# Patient Record
Sex: Female | Born: 1965 | Race: Black or African American | Hispanic: No | Marital: Single
Health system: Southern US, Community
[De-identification: ages and names within clinical notes are randomized; demographics above are authoritative.]

## PROBLEM LIST (undated history)

## (undated) DIAGNOSIS — I10 Essential (primary) hypertension: Secondary | ICD-10-CM

## (undated) DIAGNOSIS — J45909 Unspecified asthma, uncomplicated: Secondary | ICD-10-CM

---

## 2019-10-21 ENCOUNTER — Encounter (HOSPITAL_COMMUNITY): Payer: Self-pay

## 2019-10-21 ENCOUNTER — Emergency Department (HOSPITAL_COMMUNITY)
Admission: EM | Admit: 2019-10-21 | Discharge: 2019-10-21 | Disposition: A | Discharge status: Home / Self Care | Payer: BLUE CROSS/BLUE SHIELD | Attending: Emergency Medicine | Admitting: Emergency Medicine

## 2019-10-21 ENCOUNTER — Emergency Department (HOSPITAL_COMMUNITY): Payer: BLUE CROSS/BLUE SHIELD

## 2019-10-21 ENCOUNTER — Other Ambulatory Visit: Payer: Self-pay

## 2019-10-21 ENCOUNTER — Emergency Department (HOSPITAL_COMMUNITY)
Admission: EM | Admit: 2019-10-21 | Discharge: 2019-10-22 | Disposition: A | Discharge status: Home / Self Care | Payer: BLUE CROSS/BLUE SHIELD | Source: Home / Self Care | Attending: Emergency Medicine | Admitting: Emergency Medicine

## 2019-10-21 DIAGNOSIS — R0789 Other chest pain: Secondary | ICD-10-CM | POA: Diagnosis present

## 2019-10-21 DIAGNOSIS — R440 Auditory hallucinations: Secondary | ICD-10-CM | POA: Insufficient documentation

## 2019-10-21 DIAGNOSIS — U071 COVID-19: Secondary | ICD-10-CM | POA: Insufficient documentation

## 2019-10-21 DIAGNOSIS — F419 Anxiety disorder, unspecified: Secondary | ICD-10-CM | POA: Diagnosis not present

## 2019-10-21 DIAGNOSIS — I10 Essential (primary) hypertension: Secondary | ICD-10-CM | POA: Insufficient documentation

## 2019-10-21 DIAGNOSIS — R079 Chest pain, unspecified: Secondary | ICD-10-CM

## 2019-10-21 DIAGNOSIS — G9340 Encephalopathy, unspecified: Secondary | ICD-10-CM | POA: Insufficient documentation

## 2019-10-21 DIAGNOSIS — R410 Disorientation, unspecified: Secondary | ICD-10-CM | POA: Insufficient documentation

## 2019-10-21 DIAGNOSIS — J45909 Unspecified asthma, uncomplicated: Secondary | ICD-10-CM | POA: Diagnosis not present

## 2019-10-21 DIAGNOSIS — R000 Tachycardia, unspecified: Secondary | ICD-10-CM | POA: Insufficient documentation

## 2019-10-21 HISTORY — DX: Essential (primary) hypertension: I10

## 2019-10-21 HISTORY — DX: Unspecified asthma, uncomplicated: J45.909

## 2019-10-21 LAB — BASIC METABOLIC PANEL
Anion gap: 4 — ABNORMAL LOW (ref 5–15)
BUN: 10 mg/dL (ref 6–20)
CO2: 25 mmol/L (ref 22–32)
Calcium: 9.1 mg/dL (ref 8.9–10.3)
Chloride: 105 mmol/L (ref 98–111)
Creatinine, Ser: 1 mg/dL (ref 0.44–1.00)
GFR calc Af Amer: >60 mL/min (ref >60)
GFR calc non Af Amer: >60 mL/min (ref >60)
Glucose, Bld: 104 mg/dL — ABNORMAL HIGH (ref 70–99)
Potassium: 3.6 mmol/L (ref 3.5–5.1)
Sodium: 134 mmol/L — ABNORMAL LOW (ref 135–145)

## 2019-10-21 LAB — CBC
HCT: 42.3 % (ref 36.0–46.0)
Hemoglobin: 13.5 g/dL (ref 12.0–15.0)
MCH: 29 pg (ref 26.0–34.0)
MCHC: 31.9 g/dL (ref 30.0–36.0)
MCV: 90.8 fL (ref 80.0–100.0)
Platelets: 316 10*3/uL (ref 150–400)
RBC: 4.66 MIL/uL (ref 3.87–5.11)
RDW: 13.2 % (ref 11.5–15.5)
WBC: 10.2 10*3/uL (ref 4.0–10.5)
nRBC: 0 % (ref 0.0–0.2)

## 2019-10-21 LAB — TROPONIN I (HIGH SENSITIVITY)
Troponin I (High Sensitivity): 9 ng/L (ref <18)
Troponin I (High Sensitivity): 8 ng/L (ref <18)

## 2019-10-21 MED ORDER — ALBUTEROL SULFATE HFA 108 (90 BASE) MCG/ACT IN AERS
2.0000 | INHALATION_SPRAY | RESPIRATORY_TRACT | Status: DC | PRN
  Administered 2019-10-21: 23:00:00 2 via RESPIRATORY_TRACT
  Filled 2019-10-21: qty 6.7

## 2019-10-21 MED ORDER — ASPIRIN 81 MG PO CHEW
324.0000 mg | CHEWABLE_TABLET | Freq: Once | ORAL | Status: AC
  Administered 2019-10-21: 324 mg via ORAL
  Filled 2019-10-21: qty 4

## 2019-10-21 MED ORDER — SODIUM CHLORIDE 0.9 % IV BOLUS
500.0000 mL | Freq: Once | INTRAVENOUS | Status: AC
  Administered 2019-10-21: 19:00:00 500 mL via INTRAVENOUS

## 2019-10-21 MED ORDER — IOHEXOL 350 MG/ML SOLN
80.0000 mL | Freq: Once | INTRAVENOUS | Status: AC | PRN
  Administered 2019-10-21: 22:00:00 80 mL via INTRAVENOUS

## 2019-10-21 NOTE — ED Notes (Signed)
Patient came into the ED via [facility] was at the registation and started pulling the seatbelts off and trying to get off the stretcher. Patient was acting very bizarre ems lowered the stretcher and patient ran out the ems doors. Ems and security tried to stop the patient because she had and IV in her arm. Patient ran up the hill and wasn't able to stop patient. Family was contacted by ems to let them know what had happened.

## 2019-10-21 NOTE — ED Triage Notes (Signed)
Pt tested positive for COVID today at an UC and sent her here for evaluation of chest pain that started last night. Pt from [place]. In [place] to take care of her elderly mother.  Hx of asthma and HTN but she is not on any BP medications. Pt states she feels as if she is laying in an ICU, feels very disoriented and confused. Pt states she has been hearing beeping noises all night. Pt very anxious in triage. resp e.u

## 2019-10-21 NOTE — Discharge Instructions (Addendum)
You were seen in the emergency department for evaluation of chest pain and a diagnosis of COVID-19 virus.  You had a CAT scan of your head and chest along with blood work and EKG.  There is no evidence of a heart attack or any blood clot.  There was an incidental finding of a thyroid nodule that will need follow-up testing.  Please follow-up with your doctor and return to the emergency department if any acute worsening symptoms.

## 2019-10-21 NOTE — ED Provider Notes (Signed)
[facility] EMERGENCY DEPARTMENT Provider Note   CSN: [id number] Arrival date & time: 10/21/19  1740     History Chief Complaint  Patient presents with  . COVID+  . Chest Pain  . Hallucinations    Traci Hodges is a 54 y.o. female.  She has a history of hypertension.  She is not on any medication for she was hoping to lose some weight to control it.  She is moved down here and staying with her mother because she was becoming more infirmed and wanting to take care of her and keep her out of her nursing home.  Today she woke up around 2 AM noticed her heart was racing and felt some chest discomfort radiating her to her left arm.  She went to an urgent care clinic where they did an EKG and a Covid test and told her she was Covid positive.  No known fevers minimal cough minimal sore throat no vomiting or diarrhea.  She feels very out of it like she is asleep and having trouble waking up.  She is hearing beeping noises.  The history is provided by the patient.  Chest Pain Pain location:  L chest Pain quality: throbbing   Pain radiates to:  L shoulder Pain severity:  Moderate Onset quality:  Sudden Timing:  Constant Progression:  Unchanged Chronicity:  New Context: at rest   Relieved by:  None tried Worsened by:  Nothing Ineffective treatments:  None tried Associated symptoms: altered mental status (?), anxiety and cough   Associated symptoms: no abdominal pain, no dysphagia, no fever, no headache, no heartburn, no nausea, no numbness, no shortness of breath and no vomiting        Past Medical History:  Diagnosis Date  . Asthma   . Hypertension     There are no problems to display for this patient.   History reviewed. No pertinent surgical history.   OB History   No obstetric history on file.     No family history on file.  Social History   Tobacco Use  . Smoking status: Not on file  Substance Use Topics  . Alcohol use: Not on file  .  Drug use: Not on file    Home Medications Prior to Admission medications   Not on File    Allergies    Patient has no allergy information on record.  Review of Systems   Review of Systems  Constitutional: Negative for fever.  HENT: Negative for sore throat and trouble swallowing.   Eyes: Negative for visual disturbance.  Respiratory: Positive for cough. Negative for shortness of breath.   Cardiovascular: Positive for chest pain.  Gastrointestinal: Negative for abdominal pain, heartburn, nausea and vomiting.  Genitourinary: Negative for dysuria.  Musculoskeletal: Negative for gait problem.  Skin: Negative for rash.  Neurological: Negative for numbness and headaches.  Psychiatric/Behavioral: Positive for confusion (?).    Physical Exam Updated Vital Signs BP (!) 171/117   Pulse (!) 114   Temp 98.6 F (37 C) (Oral)   Resp 20   Ht 5\' 6"  (1.676 m)   Wt 113.4 kg   SpO2 98%   BMI 40.35 kg/m   Physical Exam Vitals and nursing note reviewed.  Constitutional:      General: She is not in acute distress.    Appearance: She is well-developed.  HENT:     Head: Normocephalic and atraumatic.  Eyes:     Conjunctiva/sclera: Conjunctivae normal.  Cardiovascular:  Rate and Rhythm: Regular rhythm. Tachycardia present.     Heart sounds: Normal heart sounds. No murmur.  Pulmonary:     Effort: Pulmonary effort is normal. No respiratory distress.     Breath sounds: Normal breath sounds.  Abdominal:     Palpations: Abdomen is soft.     Tenderness: There is no abdominal tenderness.  Musculoskeletal:        General: Normal range of motion.     Cervical back: Neck supple.     Right lower leg: No tenderness.     Left lower leg: No tenderness.  Skin:    General: Skin is warm and dry.     Capillary Refill: Capillary refill takes less than 2 seconds.  Neurological:     General: No focal deficit present.     Mental Status: She is alert and oriented to person, place, and time.      Cranial Nerves: No cranial nerve deficit.     Motor: No weakness.     ED Results / Procedures / Treatments   Labs (all labs ordered are listed, but only abnormal results are displayed) Labs Reviewed  BASIC METABOLIC PANEL - Abnormal; Notable for the following components:      Result Value   Sodium 134 (*)    Glucose, Bld 104 (*)    Anion gap 4 (*)    All other components within normal limits  CBC  TROPONIN I (HIGH SENSITIVITY)    EKG EKG Interpretation  Date/Time:  Thursday October 21 2019 20:08:23 EST Ventricular Rate:  104 PR Interval:    QRS Duration: 98 QT Interval:  348 QTC Calculation: 458 R Axis:   38 Text Interpretation: Sinus tachycardia Consider right atrial enlargement Posterior infarct, old Nonspecific repol abnormality, lateral leads Poor data quality No old tracing to compare Confirmed by Meridee Score [phone]) on 10/21/2019 8:11:31 PM   Radiology CT Head Wo Contrast  Result Date: 10/21/2019 CLINICAL DATA:  54 year old female with encephalopathy. EXAM: CT HEAD WITHOUT CONTRAST TECHNIQUE: Contiguous axial images were obtained from the base of the skull through the vertex without intravenous contrast. COMPARISON:  None. FINDINGS: Brain: There is mild age-related atrophy and chronic microvascular ischemic changes. There is no acute intracranial hemorrhage. No mass effect or midline shift. No extra-axial fluid collection. Vascular: No hyperdense vessel or unexpected calcification. Skull: Normal. Negative for fracture or focal lesion. Sinuses/Orbits: The visualized paranasal sinuses and the left mastoid air cells are clear. Right mastoid effusions noted. Other: None IMPRESSION: 1. No acute intracranial pathology. 2. Mild age-related atrophy and chronic microvascular ischemic changes. 3. Right mastoid effusion. Electronically Signed   By: Elgie Collard M.D.   On: 10/21/2019 22:12   CT Angio Chest PE W/Cm &/Or Wo Cm  Result Date: 10/21/2019 CLINICAL DATA:  Shortness of  breath. Tested positive for COVID-19 today. EXAM: CT ANGIOGRAPHY CHEST WITH CONTRAST TECHNIQUE: Multidetector CT imaging of the chest was performed using the standard protocol during bolus administration of intravenous contrast. Multiplanar CT image reconstructions and MIPs were obtained to evaluate the vascular anatomy. CONTRAST:  52mL OMNIPAQUE IOHEXOL 350 MG/ML SOLN COMPARISON:  None. FINDINGS: Cardiovascular: Evaluation for pulmonary emboli is limited by streak artifact from the patient's arms.Given these limitations, no acute pulmonary embolism was detected. The main pulmonary artery is within normal limits for size. There is no CT evidence of acute right heart strain. There is an aberrant right subclavian artery, a normal variant. Heart size is normal, without pericardial effusion. Mediastinum/Nodes: --No mediastinal or hilar  lymphadenopathy. --No axillary lymphadenopathy. --No supraclavicular lymphadenopathy. --there is a left-sided thyroid nodule measuring approximately 2.5 cm. --The esophagus is unremarkable Lungs/Pleura: No pulmonary nodules or masses. No pleural effusion or pneumothorax. No focal airspace consolidation. No focal pleural abnormality. Upper Abdomen: No acute abnormality. Musculoskeletal: No chest wall abnormality. No acute or significant osseous findings. Review of the MIP images confirms the above findings. IMPRESSION: 1. Limited study as detailed above. Given these limitations, no acute pulmonary embolism was detected. 2. There is a 2.5 cm left thyroid nodule. Follow-up with a nonemergent outpatient thyroid ultrasound is recommended. 3. Incidentally noted aberrant right subclavian artery, a normal variant. Electronically Signed   By: Constance Holster M.D.   On: 10/21/2019 22:14   DG Chest Portable 1 View  Result Date: 10/21/2019 CLINICAL DATA:  COVID-19 positivity with chest pain, initial encounter EXAM: PORTABLE CHEST 1 VIEW COMPARISON:  None. FINDINGS: The heart size and  mediastinal contours are within normal limits. Both lungs are clear. The visualized skeletal structures are unremarkable. IMPRESSION: No active disease. Electronically Signed   By: Inez Catalina M.D.   On: 10/21/2019 20:40    Procedures Procedures (including critical care time)  Medications Ordered in ED Medications  albuterol (VENTOLIN HFA) 108 (90 Base) MCG/ACT inhaler 2 puff (2 puffs Inhalation Given 10/21/19 2236)  sodium chloride 0.9 % bolus 500 mL (0 mLs Intravenous Stopped 10/21/19 2109)  aspirin chewable tablet 324 mg (324 mg Oral Given 10/21/19 1850)  iohexol (OMNIPAQUE) 350 MG/ML injection 80 mL (80 mLs Intravenous Contrast Given 10/21/19 2150)    ED Course  I have reviewed the triage vital signs and the nursing notes.  Pertinent labs & imaging results that were available during my care of the patient were reviewed by me and considered in my medical decision making (see chart for details).  Clinical Course as of Oct 21 1012  Thu Feb 04, 132  [id number] 54 year old female here with acute onset of tachycardia and chest pain.  Differential includes arrhythmia, A. fib, CHF, pneumonia, anemia.  She found out she was Covid positive when she went to an urgent care.  Not really having much for Covid symptoms.  She said she is confused and may be hearing things although I think she may be just overwhelmed with taking care of her mother and now having Covid.  She is alert and oriented.  Labs coming back and first troponin is 9.  No increased white count.  Electrolytes within normal limits.  Satting 98% on room air.  Initially was tachycardic in sinus and heart rate has come down with some IV fluids.   [MB]  2220 Also troponin unchanged.  Head CT showing no acute findings.  Incidental mastoid effusion.  Chest CT limited but no gross PE.  Incidental thyroid nodule needing outpatient follow-up.   [MB]  2234 I reviewed the patient's results with her.  She seemed more resigned to the fact that this is real.  I  asked her if she was interested in talking to psychiatry and she said not at this time.  I have asked social work to reach out to her to see if they could give her some ideas about housing.  Patient states she cannot return home because she would expose her mother to Covid.   [MB]  2259 TOC trying to see if she may qualify for the Covid hotel.   [MB]  2334 Unfortunately she was not able to be set up with a Covid hospital as she is not  homeless.  Social work is seeing if she would qualify for the home visits where they would come in and check her pulse ox and make sure she is doing okay.  Patient understands that she does not meet inpatient criteria for admission.   [MB]    Clinical Course User Index [MB] Terrilee Files, MD   MDM Rules/Calculators/A&P                     Traci Hodges was evaluated in Emergency Department on 10/21/2019 for the symptoms described in the history of present illness. She was evaluated in the context of the global COVID-19 pandemic, which necessitated consideration that the patient might be at risk for infection with the SARS-CoV-2 virus that causes COVID-19. Institutional protocols and algorithms that pertain to the evaluation of patients at risk for COVID-19 are in a state of rapid change based on information released by regulatory bodies including the CDC and federal and state organizations. These policies and algorithms were followed during the patient's care in the ED.   Final Clinical Impression(s) / ED Diagnoses Final diagnoses:  Nonspecific chest pain  COVID-19 virus infection    Rx / DC Orders ED Discharge Orders    None       Terrilee Files, MD 10/22/19 1014

## 2019-10-22 NOTE — Care Management (Signed)
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 Patient is concerned about not having space to isolate sleeping on the sofa in the living room. Patient has comorbidities and c/o CP.  She has made arrangement to isolate at the [facility] [place] on [address],  CM contacted [facility] and they will follow up with patient, CM explained that Staff will have to wear PPE before entering room patient verbalizes understanding. CM will send referral to [facility] [phone]

## 2019-10-22 NOTE — ED Notes (Signed)
Patient verbalizes understanding of discharge instructions. Opportunity for questioning and answers were provided. Armband removed by staff, pt discharged from ED via taxi voucher. Tamsen Roers, Nicki [place], Sister, and this RN made pt aware of plan to go to [facility] hotel set up by her sister.

## 2020-12-09 IMAGING — CT CT HEAD W/O CM
3 series · 16 of 47 positions shown, 19 images · non-contrast
Comparison: None.

CLINICAL DATA: 53-year-old female with encephalopathy.

EXAM:
CT HEAD WITHOUT CONTRAST
TECHNIQUE: Contiguous axial images were obtained from the base of the skull
through the vertex without intravenous contrast.

[Series 3: head 5.0 h30s · axial · 0.47mm/px · z∈[-113,+22]mm · 10 of 33 slices shown, 13 images]
[im 3/33  brain]
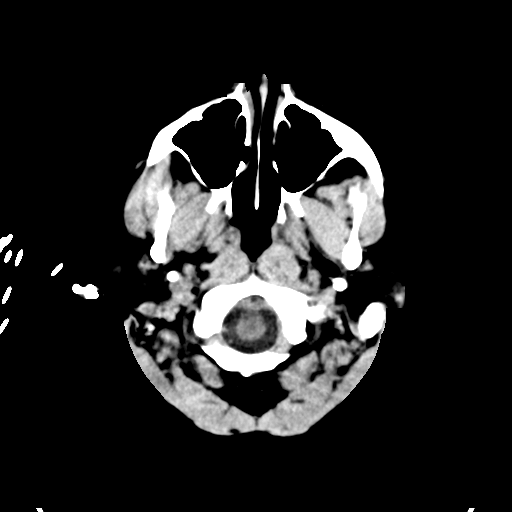
[im 3/33  bone]
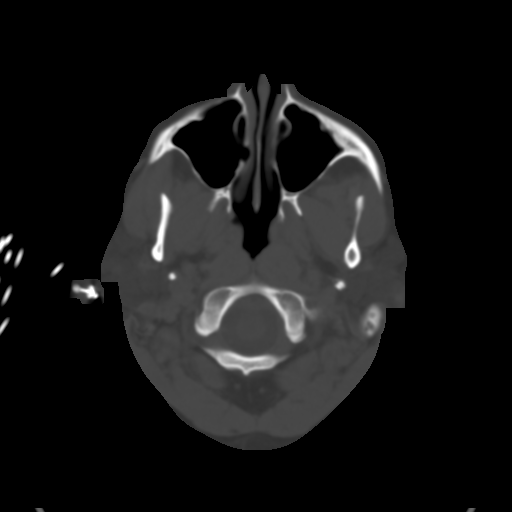
[im 6/33  brain]
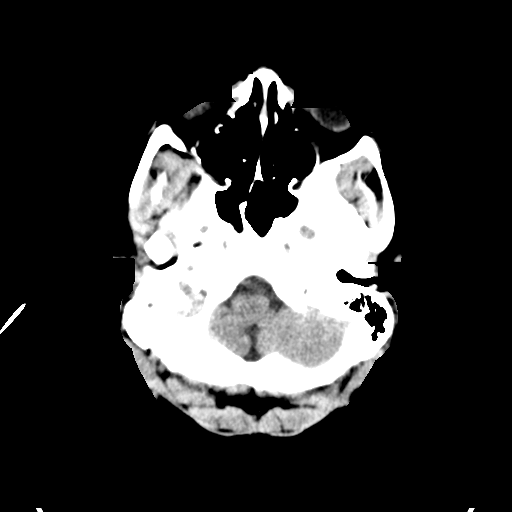
[im 9/33  brain]
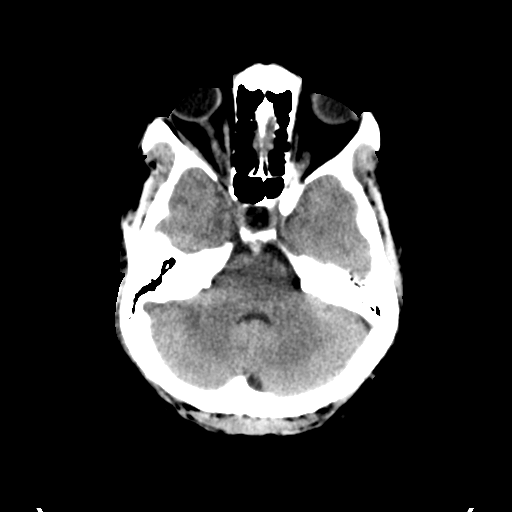
[im 12/33  brain]
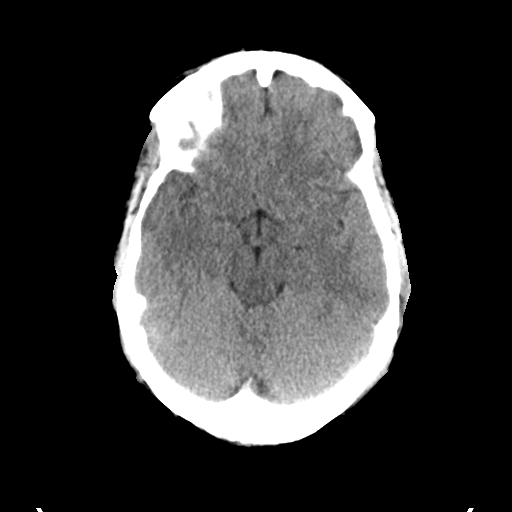
[im 15/33  brain]
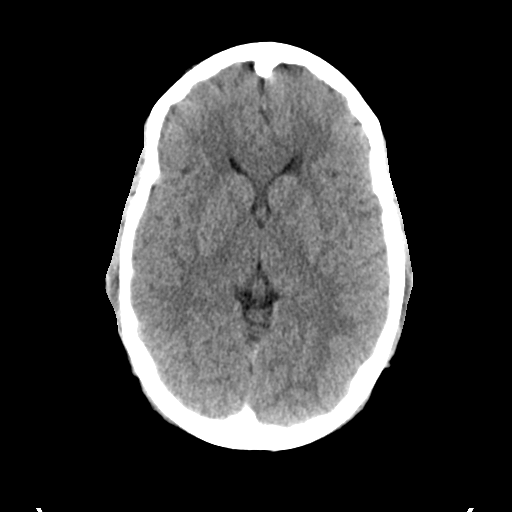
[im 15/33  bone]
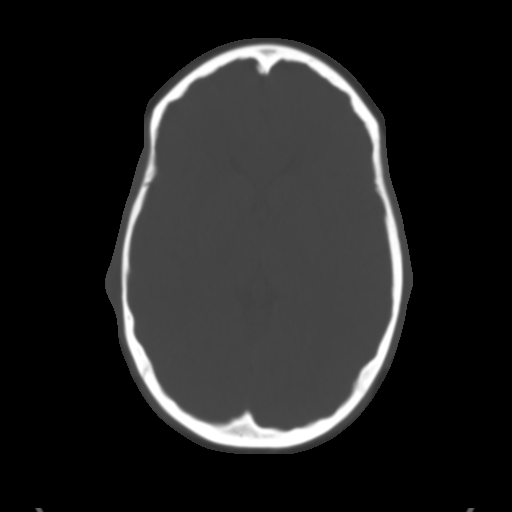
[im 18/33  brain]
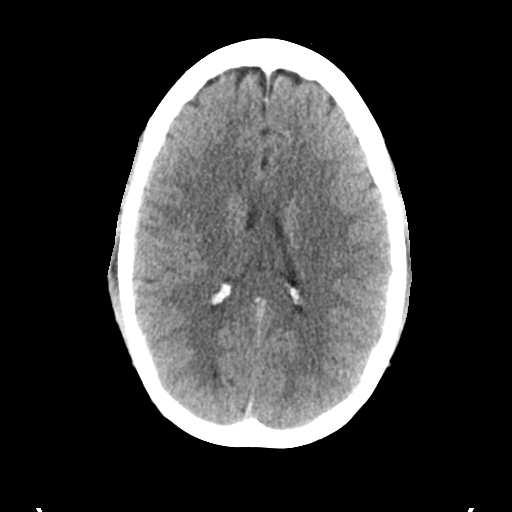
[im 21/33  brain]
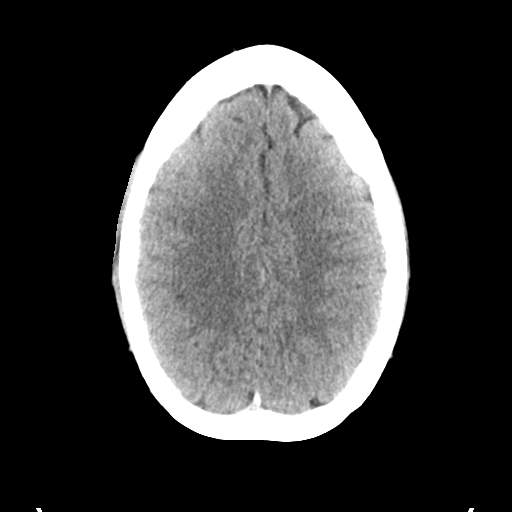
[im 25/33  brain]
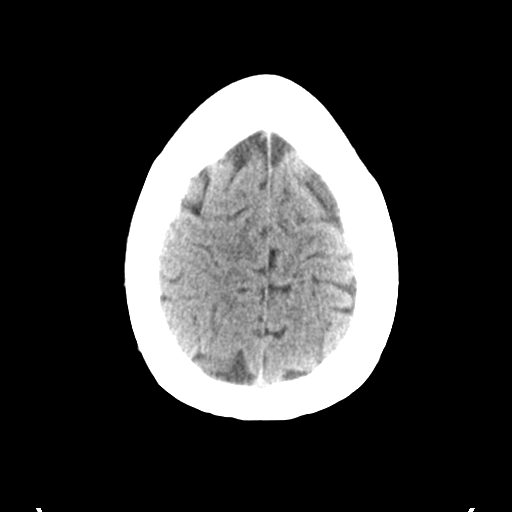
[im 27/33  brain]
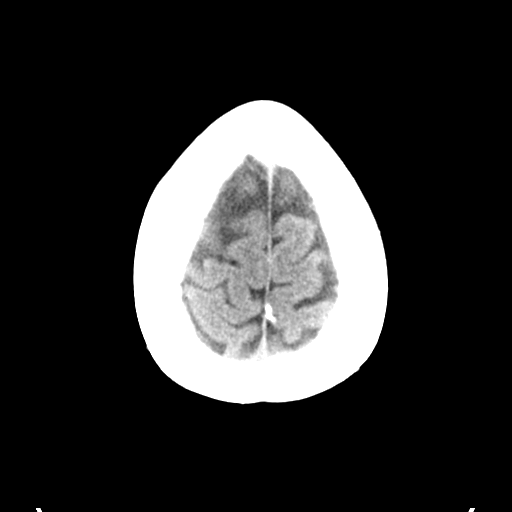
[im 27/33  bone]
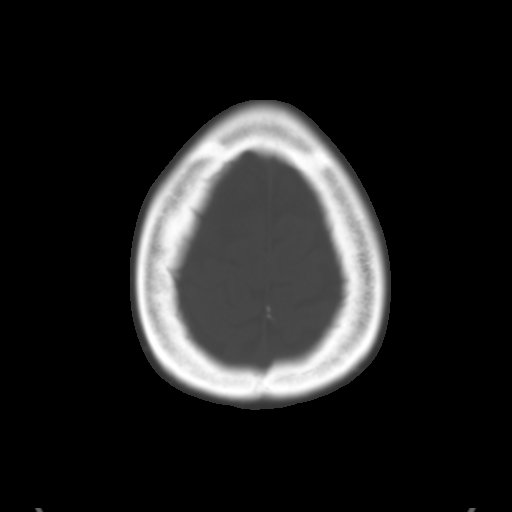
[im 30/33  brain]
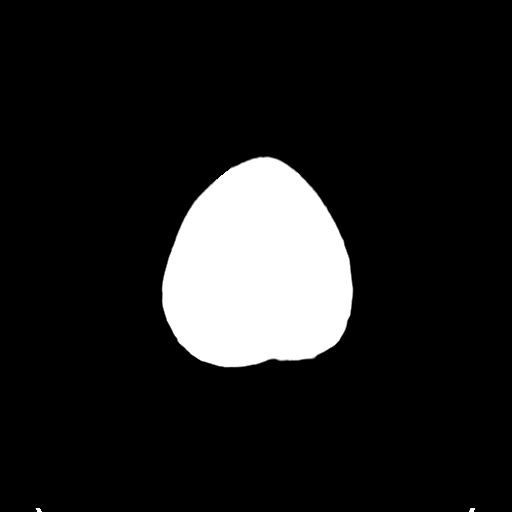

[Series 5: head 3.0 mpr cor · coronal · 0.34mm/px · 3 of 67 slices shown]
[im 23/67  brain]
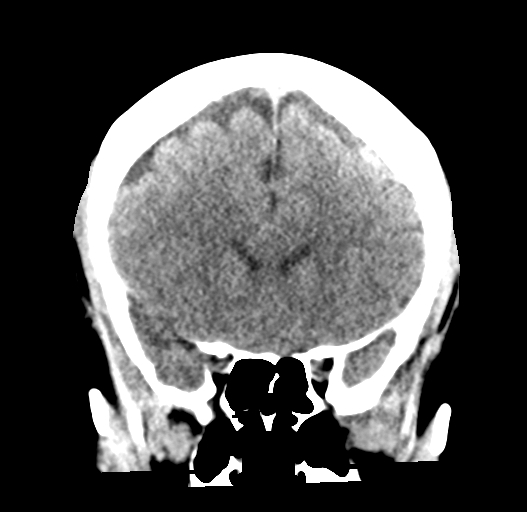
[im 30/67  brain]
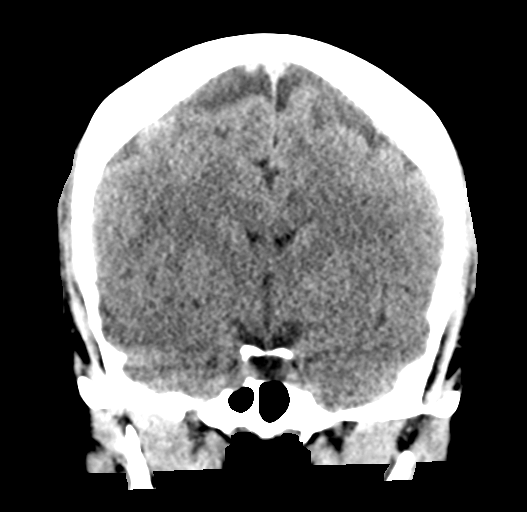
[im 37/67  brain]
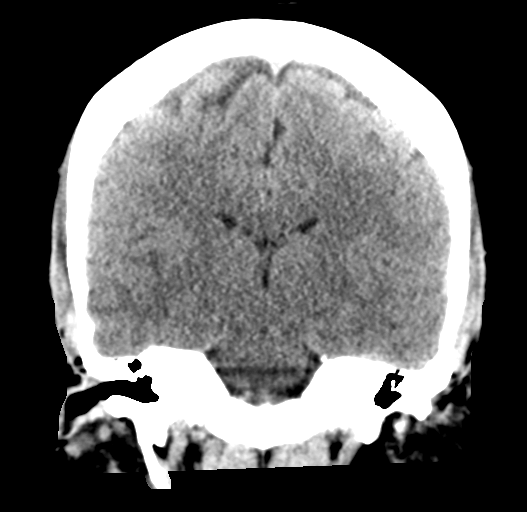

[Series 6: head 3.0 mpr sag · sagittal · 0.32mm/px · 3 of 54 slices shown]
[im 18/54  brain]
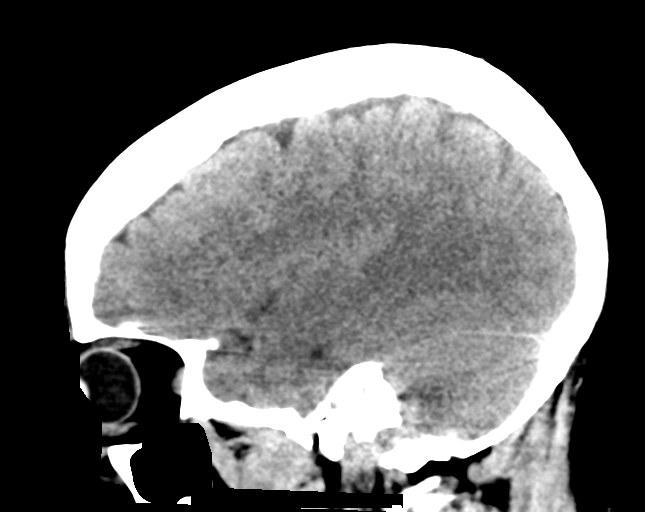
[im 27/54  brain]
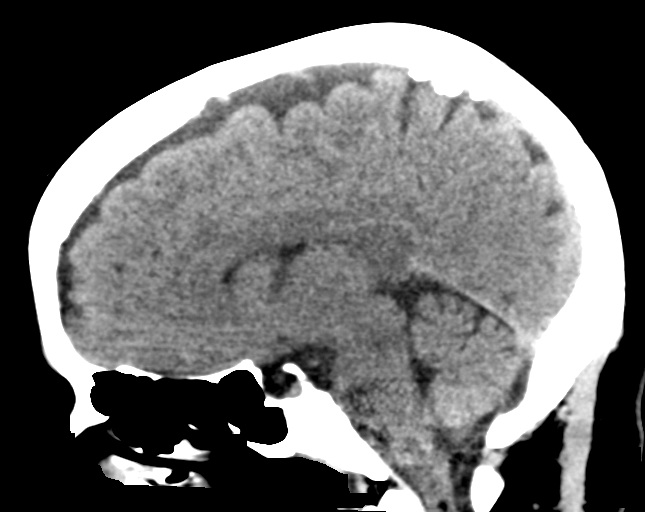
[im 36/54  brain]
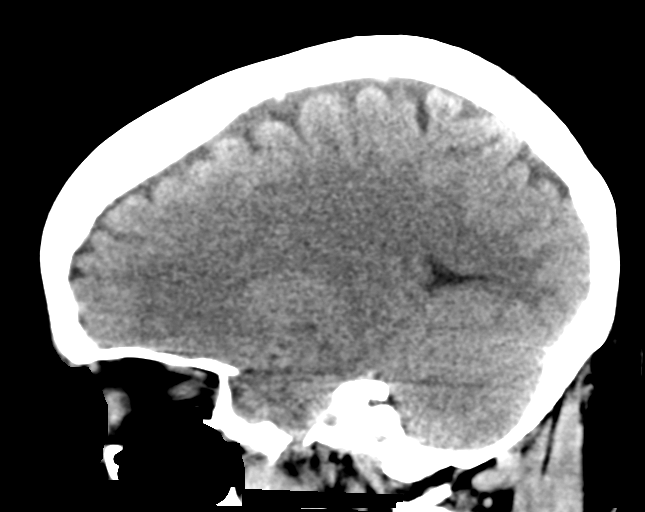

[16 of 47 positions shown; findings below may reference images not displayed]

FINDINGS: Brain: There is mild age-related atrophy and chronic microvascular
ischemic changes. There is no acute intracranial hemorrhage. No mass
effect or midline shift. No extra-axial fluid collection.

Vascular: No hyperdense vessel or unexpected calcification.

Skull: Normal. Negative for fracture or focal lesion.

Sinuses/Orbits: The visualized paranasal sinuses and the left
mastoid air cells are clear. Right mastoid effusions noted.

Other: None
IMPRESSION: 1. No acute intracranial pathology.
2. Mild age-related atrophy and chronic microvascular ischemic
changes.
3. Right mastoid effusion.
